# Patient Record
Sex: Male | Born: 1975 | Race: White | Hispanic: No | Marital: Single | Smoking: Current every day smoker
Health system: Southern US, Community
[De-identification: ages and names within clinical notes are randomized; demographics above are authoritative.]

## PROBLEM LIST (undated history)

## (undated) DIAGNOSIS — J449 Chronic obstructive pulmonary disease, unspecified: Secondary | ICD-10-CM

## (undated) DIAGNOSIS — J45909 Unspecified asthma, uncomplicated: Secondary | ICD-10-CM

---

## 2001-05-08 ENCOUNTER — Ambulatory Visit (HOSPITAL_BASED_OUTPATIENT_CLINIC_OR_DEPARTMENT_OTHER): Admission: RE | Admit: 2001-05-08 | Discharge: 2001-05-08 | Payer: Self-pay | Admitting: Dentistry

## 2009-01-18 ENCOUNTER — Emergency Department (HOSPITAL_COMMUNITY): Admission: EM | Admit: 2009-01-18 | Discharge: 2009-01-18 | Payer: Self-pay | Admitting: Family Medicine

## 2011-01-19 NOTE — Op Note (Signed)
[place]  Patient:    Dakota, Timme Ferguson Visit Number: [id number] MRN: [id number]          Service Type: Attending:  Janeece Riggers. Renard Hamper, D.D.S. Dictated by:   Janeece Riggers Renard Hamper, D.D.S. Proc. Date: 05/08/01                             Operative Report  PREOPERATIVE DIAGNOSES: 1. Dental abscess, #10. 2. Gross decay, active severe gum disease. 3. Severe dental phobia.  POSTOPERATIVE DIAGNOSES: 1. Dental abscess, #10. 2. Gross decay, active severe gum disease. 3. Severe dental phobia.  OPERATION:  Extract tooth #10 and #31, crown and bridge preparation tooth #6-13, #18-21 and #30.  Fillings, periodontal scaling and root planing.  SURGEON:  Mark E. Renard Hamper, D.D.S.  ANESTHESIA:  GA via NATT.  ESTIMATED BLOOD LOSS:  Less than 10 cc.  SPECIMENS/CULTURES/DRAINS:  None.  DESCRIPTION OF PROCEDURE:  The patient was brought to day surgery center at [facility].  Consent was obtained from the patient and from the mother, Dakota Ferguson, who has power-of-attorney.  Anesthesia 7.2 cc of 0.5% Marcaine was introduced in all four quadrants.  Throat pack soaked in sterile saline was placed.  Attention was turned to the maxilla where tooth #6-13 had the decay removed and prepared for crown and bridge restorations.  Tooth #3, 4, 5, 14, and 15 were prepared for tooth colored fillings.  Attention was turned to the mandible where teeth #18, #19, #20, #21 were prepared for porcelain fused metal crowns.  Tooth #27, #28, #29 were prepared and filled with composite resin.  Tooth #30 was prepared for porcelain fused metal crown.  Attention was turned to the upper left side where tooth #10 was released with a periosteal elevator, elevated and delivered via forceps.  It was curetted and 4-0 chromic gut suture placed.  Attention was turned to the mandibular right quadrant where tooth #31 was released, elevated and forceps delivered without complication.  The socket  was curetted.  Provisional acrylic crowns were placed.  Four quadrants periodontal root planing was accomplished.  The throat pack was removed, and the patient was returned to anesthesia in good condition. Dictated by:   Janeece Riggers Renard Hamper, D.D.S. Attending:  Janeece Riggers Renard Hamper, D.D.S. DD:  05/08/01 TD:  05/08/01 Job: [id number]

## 2018-11-08 DIAGNOSIS — F209 Schizophrenia, unspecified: Secondary | ICD-10-CM | POA: Insufficient documentation

## 2018-11-08 DIAGNOSIS — F4310 Post-traumatic stress disorder, unspecified: Secondary | ICD-10-CM | POA: Insufficient documentation

## 2019-03-31 DIAGNOSIS — J449 Chronic obstructive pulmonary disease, unspecified: Secondary | ICD-10-CM | POA: Insufficient documentation

## 2019-03-31 DIAGNOSIS — M545 Low back pain, unspecified: Secondary | ICD-10-CM | POA: Insufficient documentation

## 2019-04-13 ENCOUNTER — Emergency Department (HOSPITAL_COMMUNITY): Payer: Medicaid Other

## 2019-04-13 ENCOUNTER — Emergency Department (HOSPITAL_COMMUNITY)
Admission: EM | Admit: 2019-04-13 | Discharge: 2019-04-13 | Disposition: A | Discharge status: Home / Self Care | Payer: Medicaid Other | Attending: Emergency Medicine | Admitting: Emergency Medicine

## 2019-04-13 ENCOUNTER — Other Ambulatory Visit: Payer: Self-pay

## 2019-04-13 ENCOUNTER — Encounter (HOSPITAL_COMMUNITY): Payer: Self-pay | Admitting: *Deleted

## 2019-04-13 DIAGNOSIS — R1084 Generalized abdominal pain: Secondary | ICD-10-CM | POA: Insufficient documentation

## 2019-04-13 DIAGNOSIS — J45909 Unspecified asthma, uncomplicated: Secondary | ICD-10-CM | POA: Insufficient documentation

## 2019-04-13 DIAGNOSIS — F17210 Nicotine dependence, cigarettes, uncomplicated: Secondary | ICD-10-CM | POA: Diagnosis not present

## 2019-04-13 DIAGNOSIS — J449 Chronic obstructive pulmonary disease, unspecified: Secondary | ICD-10-CM | POA: Diagnosis not present

## 2019-04-13 DIAGNOSIS — R140 Abdominal distension (gaseous): Secondary | ICD-10-CM | POA: Diagnosis present

## 2019-04-13 HISTORY — DX: Unspecified asthma, uncomplicated: J45.909

## 2019-04-13 HISTORY — DX: Chronic obstructive pulmonary disease, unspecified: J44.9

## 2019-04-13 LAB — CBC
HCT: 41.6 % (ref 39.0–52.0)
Hemoglobin: 14.1 g/dL (ref 13.0–17.0)
MCH: 30.9 pg (ref 26.0–34.0)
MCHC: 33.9 g/dL (ref 30.0–36.0)
MCV: 91 fL (ref 80.0–100.0)
Platelets: 217 10*3/uL (ref 150–400)
RBC: 4.57 MIL/uL (ref 4.22–5.81)
RDW: 12.8 % (ref 11.5–15.5)
WBC: 8.4 10*3/uL (ref 4.0–10.5)
nRBC: 0 % (ref 0.0–0.2)

## 2019-04-13 LAB — COMPREHENSIVE METABOLIC PANEL
ALT: 30 U/L (ref 0–44)
AST: 30 U/L (ref 15–41)
Albumin: 3.9 g/dL (ref 3.5–5.0)
Alkaline Phosphatase: 56 U/L (ref 38–126)
Anion gap: 9 (ref 5–15)
BUN: 7 mg/dL (ref 6–20)
CO2: 26 mmol/L (ref 22–32)
Calcium: 8.9 mg/dL (ref 8.9–10.3)
Chloride: 104 mmol/L (ref 98–111)
Creatinine, Ser: 0.94 mg/dL (ref 0.61–1.24)
GFR calc Af Amer: >60 mL/min (ref >60)
GFR calc non Af Amer: >60 mL/min (ref >60)
Glucose, Bld: 84 mg/dL (ref 70–99)
Potassium: 3.4 mmol/L — ABNORMAL LOW (ref 3.5–5.1)
Sodium: 139 mmol/L (ref 135–145)
Total Bilirubin: 0.5 mg/dL (ref 0.3–1.2)
Total Protein: 6.8 g/dL (ref 6.5–8.1)

## 2019-04-13 LAB — LIPASE, BLOOD: Lipase: 47 U/L (ref 11–51)

## 2019-04-13 LAB — URINALYSIS, ROUTINE W REFLEX MICROSCOPIC
Bilirubin Urine: NEGATIVE
Glucose, UA: NEGATIVE mg/dL
Hgb urine dipstick: NEGATIVE
Ketones, ur: NEGATIVE mg/dL
Leukocytes,Ua: NEGATIVE
Nitrite: NEGATIVE
Protein, ur: NEGATIVE mg/dL
Specific Gravity, Urine: 1.025 (ref 1.005–1.030)
pH: 7 (ref 5.0–8.0)

## 2019-04-13 MED ORDER — IOHEXOL 300 MG/ML  SOLN
100.0000 mL | Freq: Once | INTRAMUSCULAR | Status: AC | PRN
  Administered 2019-04-13: 09:00:00 100 mL via INTRAVENOUS

## 2019-04-13 MED ORDER — POTASSIUM CHLORIDE CRYS ER 20 MEQ PO TBCR
40.0000 meq | EXTENDED_RELEASE_TABLET | Freq: Once | ORAL | Status: DC
  Filled 2019-04-13: qty 2

## 2019-04-13 MED ORDER — DICYCLOMINE HCL 20 MG PO TABS: 20.0000 mg | ORAL_TABLET | Freq: Three times a day (TID) | ORAL | 0 refills | Status: DC

## 2019-04-13 MED ORDER — SODIUM CHLORIDE 0.9 % IV BOLUS
1000.0000 mL | Freq: Once | INTRAVENOUS | Status: AC
  Administered 2019-04-13: 08:00:00 1000 mL via INTRAVENOUS

## 2019-04-13 MED ORDER — SODIUM CHLORIDE 0.9% FLUSH: 3.0000 mL | Freq: Once | INTRAVENOUS | Status: DC

## 2019-04-13 MED ORDER — HYDROMORPHONE HCL 1 MG/ML IJ SOLN
1.0000 mg | Freq: Once | INTRAMUSCULAR | Status: AC
  Administered 2019-04-13: 08:00:00 1 mg via INTRAVENOUS
  Filled 2019-04-13: qty 1

## 2019-04-13 NOTE — Discharge Instructions (Signed)
If you develop worsening, continued, or recurrent abdominal pain, uncontrolled vomiting, fever, chest or back pain, or any other new/concerning symptoms then return to the ER for evaluation.  

## 2019-04-13 NOTE — ED Notes (Signed)
The pt arrived by ambulance with c/o abd pain for one month   He has been seen in [place] for the same.  He feels like his abd is swollen nausea vomiting and diarrhea he is a smoker  Generalized abd upper and lower abd

## 2019-04-13 NOTE — ED Triage Notes (Signed)
Patient presents to ed via Dakota Ferguson. Ems c/o abd. Pain onset 1 month ago states he was seen at [facility] however wasn't given a dx. States last 2 weeks progressively gotten worse with abd. Distention. Denies n/v states he doesn't have some diarrhea.

## 2019-04-13 NOTE — ED Provider Notes (Signed)
[facility] EMERGENCY DEPARTMENT Provider Note   CSN: [id number] Arrival date & time: 04/13/19  8185    History   Chief Complaint Chief Complaint  Patient presents with  . Abdominal Pain    HPI Dakota Ferguson is a 43 y.o. male.     HPI  43 year old male presents with abdominal pain.  It has been progressively worsening for about a month.  He feels like his abdomen is swollen and feels like something is pushing from the inside out but also towards his back.  He has chronic back pain that he states feels like it is worse with this pain.  No vomiting.  The pain is currently not a 6 out of 10.  He has had diarrhea during this time, about 2 or 3 episodes per day.  No urinary symptoms. Tried tylenol and goody powder occasionally.  Past Medical History:  Diagnosis Date  . Asthma   . COPD (chronic obstructive pulmonary disease) (HCC)     There are no active problems to display for this patient.   History reviewed. No pertinent surgical history.      Home Medications    Prior to Admission medications   Medication Sig Start Date End Date Taking? Authorizing Provider  dicyclomine (BENTYL) 20 MG tablet Take 1 tablet (20 mg total) by mouth 3 (three) times daily before meals. 04/13/19   Sherwood Gambler, MD    Family History No family history on file.  Social History Social History   Tobacco Use  . Smoking status: Current Some Day Smoker  . Smokeless tobacco: Never Used  Substance Use Topics  . Alcohol use: Never    Frequency: Never  . Drug use: Never     Allergies   Patient has no known allergies.   Review of Systems Review of Systems  Gastrointestinal: Positive for abdominal pain and diarrhea. Negative for vomiting.  Genitourinary: Negative for dysuria.  Musculoskeletal: Positive for back pain.  All other systems reviewed and are negative.    Physical Exam Updated Vital Signs BP 113/76   Pulse 60   Temp 97.9 F (36.6 C) (Oral)   Resp 16    Ht 6' (1.829 m)   Wt 81.6 kg   SpO2 96%   BMI 24.41 kg/m   Physical Exam Vitals signs and nursing note reviewed.  Constitutional:      Appearance: He is well-developed.  HENT:     Head: Normocephalic and atraumatic.     Right Ear: External ear normal.     Left Ear: External ear normal.     Nose: Nose normal.  Eyes:     General:        Right eye: No discharge.        Left eye: No discharge.  Neck:     Musculoskeletal: Neck supple.  Cardiovascular:     Rate and Rhythm: Normal rate and regular rhythm.     Heart sounds: Normal heart sounds.  Pulmonary:     Effort: Pulmonary effort is normal.     Breath sounds: Normal breath sounds.  Abdominal:     Palpations: Abdomen is soft.     Tenderness: There is generalized abdominal tenderness (mild). There is no right CVA tenderness, left CVA tenderness or guarding.  Musculoskeletal:     Lumbar back: He exhibits tenderness (mild).  Skin:    General: Skin is warm and dry.  Neurological:     Mental Status: He is alert.  Psychiatric:  Mood and Affect: Mood is not anxious.      ED Treatments / Results  Labs (all labs ordered are listed, but only abnormal results are displayed) Labs Reviewed  COMPREHENSIVE METABOLIC PANEL - Abnormal; Notable for the following components:      Result Value   Potassium 3.4 (*)    All other components within normal limits  URINALYSIS, ROUTINE W REFLEX MICROSCOPIC - Abnormal; Notable for the following components:   Color, Urine AMBER (*)    APPearance HAZY (*)    All other components within normal limits  LIPASE, BLOOD  CBC    EKG None  Radiology Ct Abdomen Pelvis W Contrast  Result Date: 04/13/2019 CLINICAL DATA:  Worsening abdominal pain and distention for 1 month. EXAM: CT ABDOMEN AND PELVIS WITH CONTRAST TECHNIQUE: Multidetector CT imaging of the abdomen and pelvis was performed using the standard protocol following bolus administration of intravenous contrast. CONTRAST:  100mL  OMNIPAQUE IOHEXOL 300 MG/ML  SOLN COMPARISON:  Abdominal radiographs 03/21/2019. Chest CT 10/13/2013. FINDINGS: Lower chest: Partially visualized right lower lobe bulla, similar to the 2015 CT. Dependent subsegmental atelectasis in the left lower lobe. No pleural effusion. Hepatobiliary: 7 mm low-density lesion in the medial segment of the left hepatic lobe, too small to fully characterize. Unremarkable gallbladder. No biliary dilatation. Pancreas: Unremarkable. Spleen: Unremarkable. Adrenals/Urinary Tract: Unremarkable adrenal glands. Numerous bilateral renal masses are again seen with the majority containing macroscopic fat as well as varying amounts of enhancing soft tissue. Some of the masses which were visible on the prior chest CT have enlarged in the interval including a 3.5 cm right upper pole mass (previously 2.9 cm). An exophytic lesion extending anteriorly from the interpolar left kidney measures 7.0 cm. There is no retroperitoneal hemorrhage. No renal calculi or hydronephrosis is evident. The bladder is unremarkable. Stomach/Bowel: The stomach is unremarkable. The 7 cm left renal lesion exerts mild mass effect on proximal small bowel loops. No bowel dilatation or inflammation is identified. The appendix is unremarkable. Vascular/Lymphatic: Normal caliber of the abdominal aorta. No enlarged lymph nodes. Reproductive: Unremarkable prostate. Other: No intraperitoneal free fluid. Musculoskeletal: No acute osseous abnormality. Moderate disc degeneration in the mid and lower lumbar spine with neural foraminal stenosis at L4-5 and L5-S1 and spinal stenosis at L3-4 and L4-5. Scattered subcentimeter sclerotic osseous foci most notable in the pelvis on the left, nonspecific. No destructive osseous lesion. IMPRESSION: 1. No acute abnormality identified in the abdomen or pelvis. 2. 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 Electronically Signed   By: Sebastian AcheAllen  Grady M.D.    On: 04/13/2019 09:31    Procedures Procedures (including critical care time)  Medications Ordered in ED Medications  sodium chloride flush (NS) 0.9 % injection 3 mL (3 mLs Intravenous Not Given 04/13/19 0745)  potassium chloride SA (K-DUR) CR tablet 40 mEq (has no administration in time range)  sodium chloride 0.9 % bolus 1,000 mL (0 mLs Intravenous Stopped 04/13/19 0820)  HYDROmorphone (DILAUDID) injection 1 mg (1 mg Intravenous Given 04/13/19 0742)  iohexol (OMNIPAQUE) 300 MG/ML solution 100 mL (100 mLs Intravenous Contrast Given 04/13/19 0857)     Initial Impression / Assessment and Plan / ED Course  I have reviewed the triage vital signs and the nursing notes.  Pertinent labs & imaging results that were available during my care of the patient were reviewed by me and considered in my medical decision making (see chart for details).        No  clear cause for the patient's generalized abdominal pain.  His labs are reassuring besides minimal hypokalemia.  CT with the cysts but no other significant findings.  At this point, I will treat with Bentyl and have him follow-up with gastroenterology.  We discussed return precautions.  Final Clinical Impressions(s) / ED Diagnoses   Final diagnoses:  Generalized abdominal pain    ED Discharge Orders         Ordered    dicyclomine (BENTYL) 20 MG tablet  3 times daily before meals     04/13/19 0944           Pricilla LovelessGoldston, Chaselyn Nanney, MD 04/13/19 [phone]

## 2019-04-13 NOTE — ED Notes (Signed)
Resting comfortably, requesting something to drink.  Reviewed POC and results at this time.  NAD noted.

## 2019-05-29 DIAGNOSIS — Z8659 Personal history of other mental and behavioral disorders: Secondary | ICD-10-CM | POA: Insufficient documentation

## 2019-05-29 DIAGNOSIS — Z720 Tobacco use: Secondary | ICD-10-CM | POA: Insufficient documentation

## 2019-05-29 DIAGNOSIS — M47816 Spondylosis without myelopathy or radiculopathy, lumbar region: Secondary | ICD-10-CM | POA: Insufficient documentation

## 2019-05-29 DIAGNOSIS — B182 Chronic viral hepatitis C: Secondary | ICD-10-CM | POA: Insufficient documentation

## 2019-11-18 DIAGNOSIS — J329 Chronic sinusitis, unspecified: Secondary | ICD-10-CM | POA: Insufficient documentation

## 2020-10-13 IMAGING — CT CT ABDOMEN AND PELVIS WITH CONTRAST
2 of 5 series · 16 of 46 positions shown, 18 images · IV contrast (Omni 300)
Comparison: Abdominal radiographs 03/21/2019. Chest CT 10/13/2013.

CLINICAL DATA: Worsening abdominal pain and distention for 1 month.

EXAM:
CT ABDOMEN AND PELVIS WITH CONTRAST
TECHNIQUE: Multidetector CT imaging of the abdomen and pelvis was performed
using the standard protocol following bolus administration of
intravenous contrast.
CONTRAST:  100mL OMNIPAQUE IOHEXOL 300 MG/ML  SOLN

[Series 3: a/p w/ 5mm · axial · 0.86mm/px · z∈[-479,-79]mm · 13 of 92 slices shown, 15 images]
[im 6/92  soft-tissue]
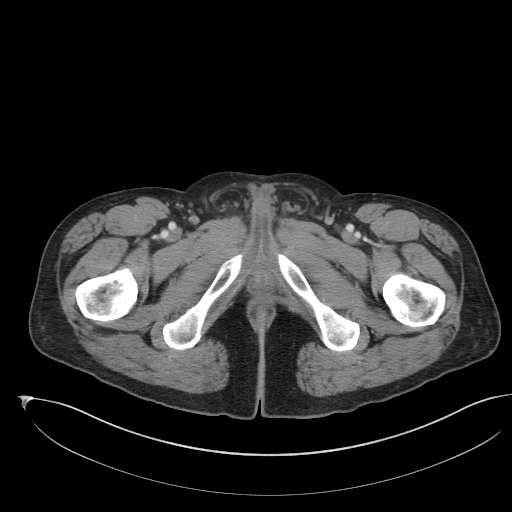
[im 6/92  bone]
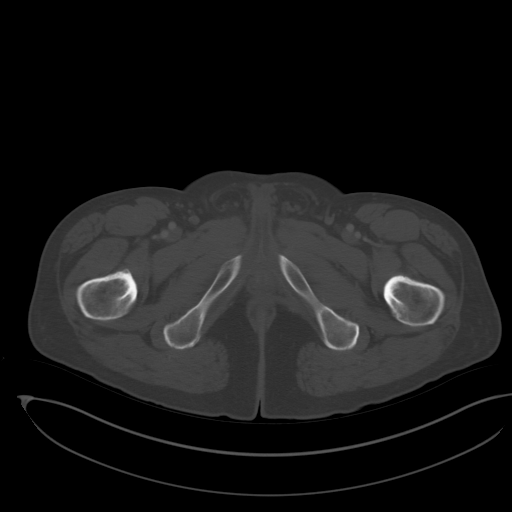
[im 11/92  soft-tissue]
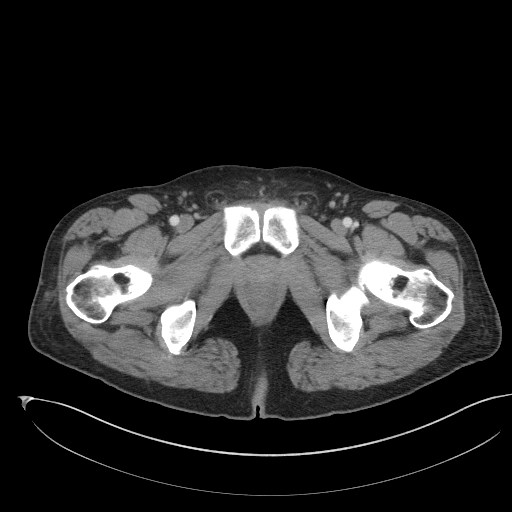
[im 21/92  soft-tissue]
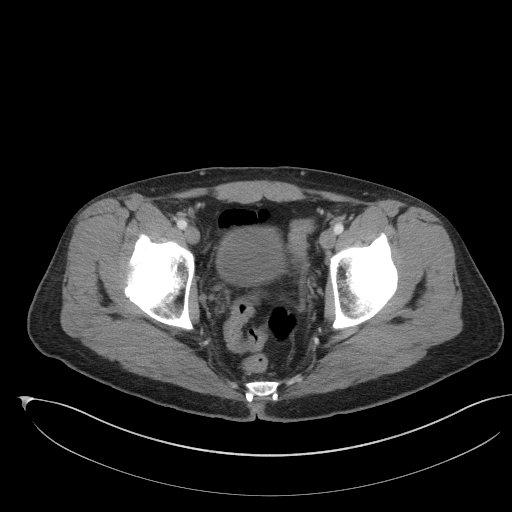
[im 26/92  soft-tissue]
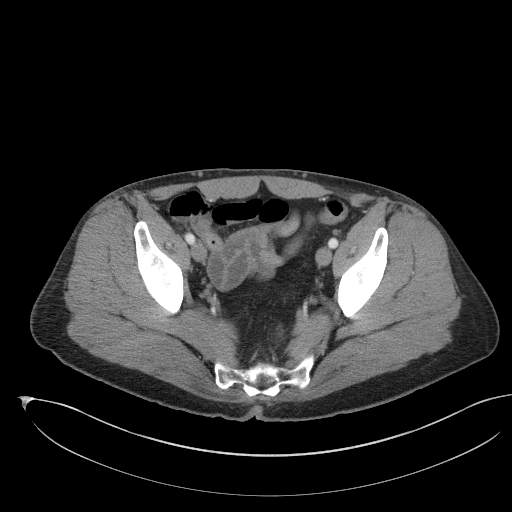
[im 31/92  soft-tissue]
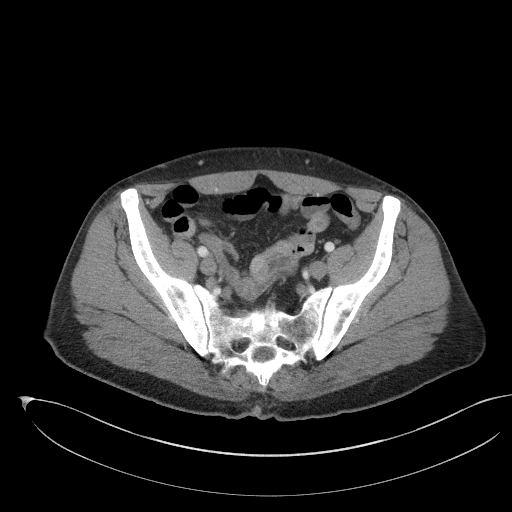
[im 41/92  soft-tissue]
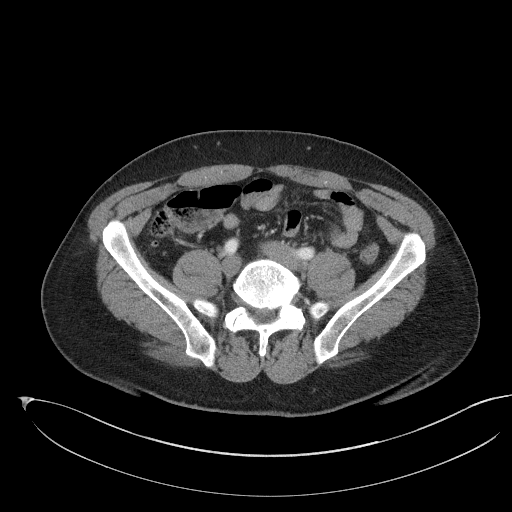
[im 46/92  soft-tissue]
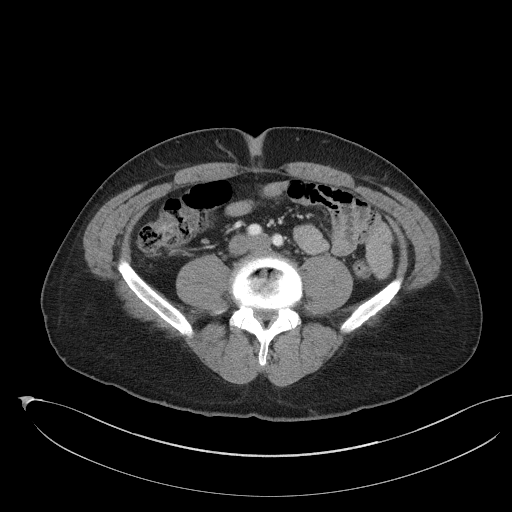
[im 51/92  soft-tissue]
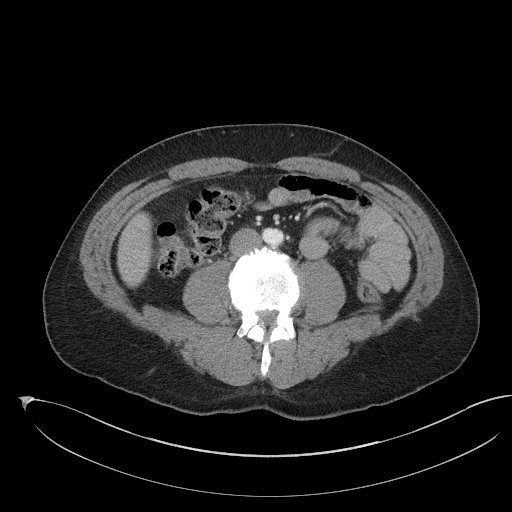
[im 61/92  soft-tissue]
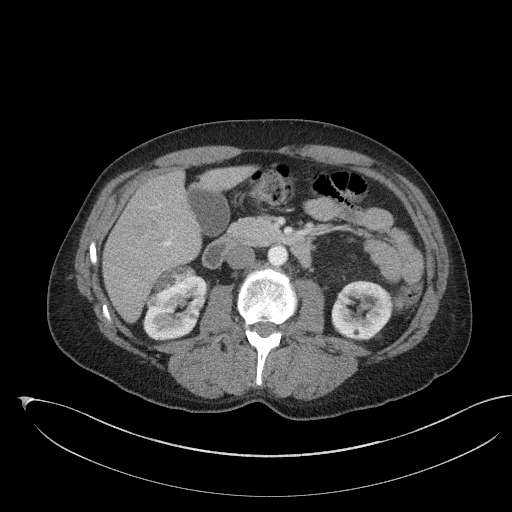
[im 61/92  bone]
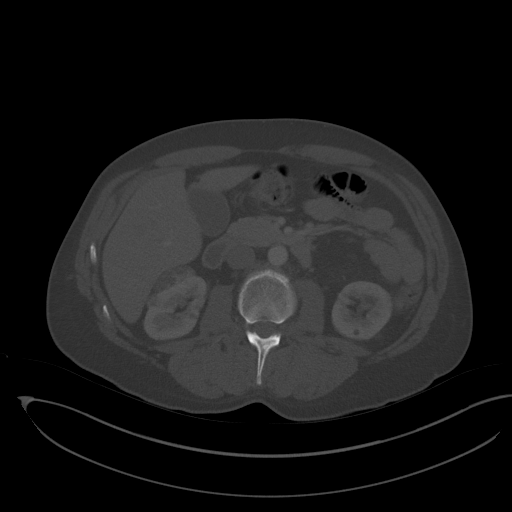
[im 66/92  soft-tissue]
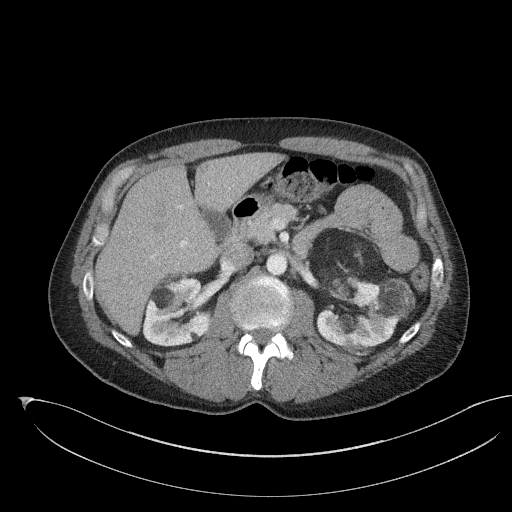
[im 71/92  soft-tissue]
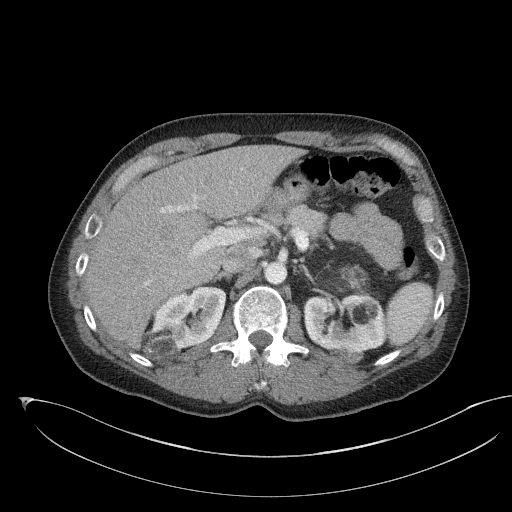
[im 81/92  soft-tissue]
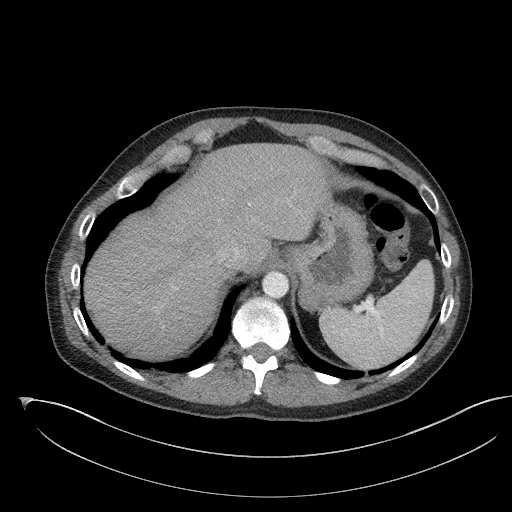
[im 86/92  soft-tissue]
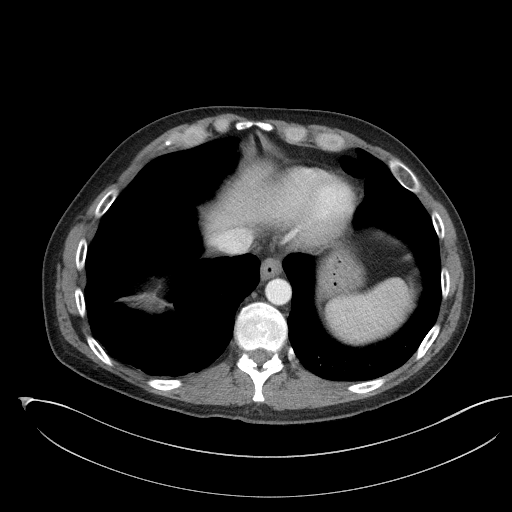

[Series 6: a/p w/ cor · coronal · 0.74mm/px · 3 of 133 slices shown]
[im 45/133  soft-tissue]
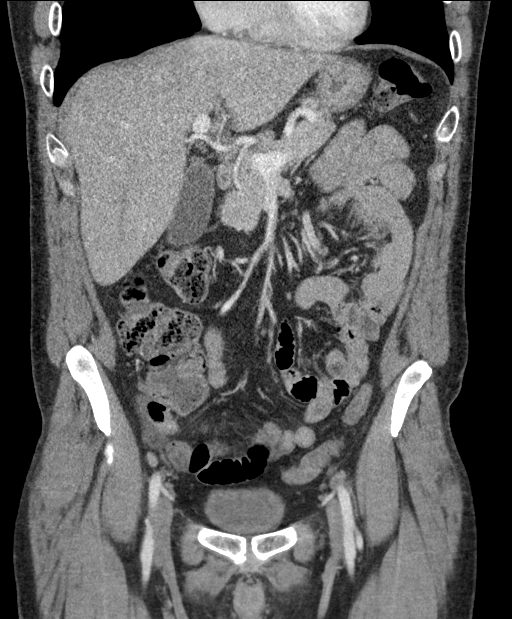
[im 59/133  soft-tissue]
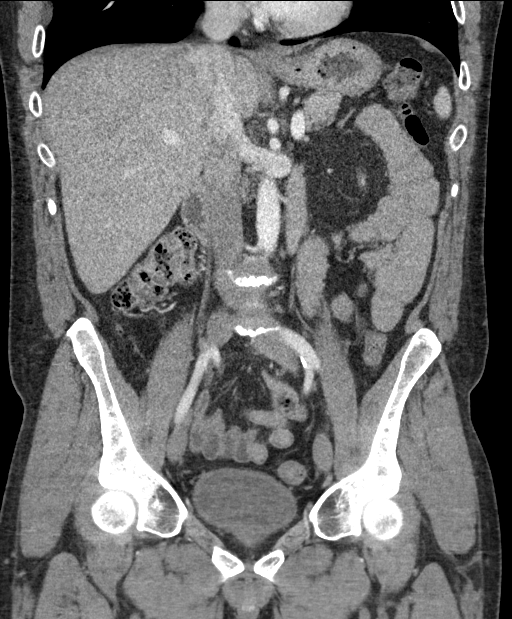
[im 74/133  soft-tissue]
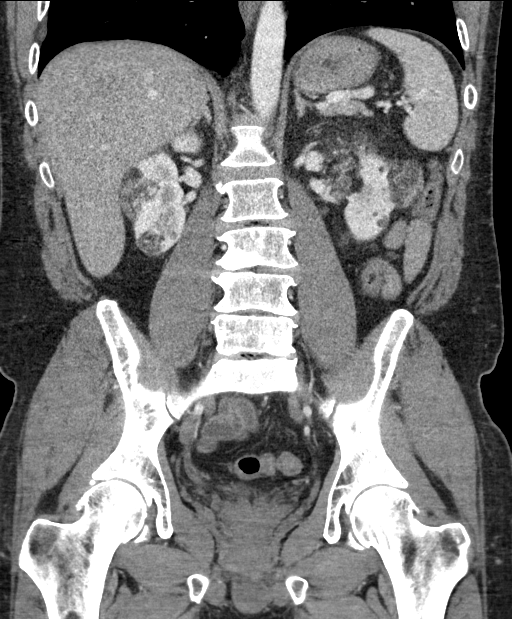

[16 of 46 positions shown; findings below may reference images not displayed]

FINDINGS: Lower chest: Partially visualized right lower lobe bulla, similar to
the 1333 CT. Dependent subsegmental atelectasis in the left lower
lobe. No pleural effusion.

Hepatobiliary: 7 mm low-density lesion in the medial segment of the
left hepatic lobe, too small to fully characterize. Unremarkable
gallbladder. No biliary dilatation.

Pancreas: Unremarkable.

Spleen: Unremarkable.

Adrenals/Urinary Tract: Unremarkable adrenal glands. Numerous
bilateral renal masses are again seen with the majority containing
macroscopic fat as well as varying amounts of enhancing soft tissue.
Some of the masses which were visible on the prior chest CT have
enlarged in the interval including a 3.5 cm right upper pole mass
(previously 2.9 cm). An exophytic lesion extending anteriorly from
the interpolar left kidney measures 7.0 cm. There is no
retroperitoneal hemorrhage. No renal calculi or hydronephrosis is
evident. The bladder is unremarkable.

Stomach/Bowel: The stomach is unremarkable. The 7 cm left renal
lesion exerts mild mass effect on proximal small bowel loops. No
bowel dilatation or inflammation is identified. The appendix is
unremarkable.

Vascular/Lymphatic: Normal caliber of the abdominal aorta. No
enlarged lymph nodes.

Reproductive: Unremarkable prostate.

Other: No intraperitoneal free fluid.

Musculoskeletal: No acute osseous abnormality. Moderate disc
degeneration in the mid and lower lumbar spine with neural foraminal
stenosis at L4-5 and L5-S1 and spinal stenosis at L3-4 and L4-5.
Scattered subcentimeter sclerotic osseous foci most notable in the
pelvis on the left, nonspecific. No destructive osseous lesion.
IMPRESSION: 1. No acute abnormality identified in the abdomen or pelvis.
2. Numerous bilateral renal masses compatible with angiomyolipomas
measuring up to 7 cm.

## 2020-11-30 ENCOUNTER — Ambulatory Visit: Payer: Medicaid Other | Admitting: Family Medicine

## 2020-12-12 ENCOUNTER — Ambulatory Visit: Payer: Medicaid Other | Admitting: Family Medicine

## 2021-06-14 DIAGNOSIS — F1190 Opioid use, unspecified, uncomplicated: Secondary | ICD-10-CM | POA: Insufficient documentation

## 2021-06-14 DIAGNOSIS — L0390 Cellulitis, unspecified: Secondary | ICD-10-CM | POA: Insufficient documentation

## 2021-06-14 DIAGNOSIS — F32A Depression, unspecified: Secondary | ICD-10-CM | POA: Insufficient documentation

## 2021-06-14 DIAGNOSIS — F1193 Opioid use, unspecified with withdrawal: Secondary | ICD-10-CM | POA: Insufficient documentation

## 2021-08-23 DIAGNOSIS — D1771 Benign lipomatous neoplasm of kidney: Secondary | ICD-10-CM | POA: Insufficient documentation

## 2021-08-23 DIAGNOSIS — Q851 Tuberous sclerosis: Secondary | ICD-10-CM | POA: Insufficient documentation

## 2022-06-30 DIAGNOSIS — L03315 Cellulitis of perineum: Secondary | ICD-10-CM | POA: Insufficient documentation

## 2022-07-09 DIAGNOSIS — M726 Necrotizing fasciitis: Secondary | ICD-10-CM | POA: Insufficient documentation

## 2023-05-14 ENCOUNTER — Telehealth: Payer: Self-pay

## 2023-05-14 ENCOUNTER — Ambulatory Visit: Payer: MEDICAID | Admitting: Student

## 2023-05-14 ENCOUNTER — Encounter: Payer: Self-pay | Admitting: Student

## 2023-05-14 VITALS — BP 102/64 | HR 81 | Temp 97.4°F | Resp 18 | Ht 72.0 in | Wt 203.4 lb

## 2023-05-14 DIAGNOSIS — Z6827 Body mass index (BMI) 27.0-27.9, adult: Secondary | ICD-10-CM

## 2023-05-14 DIAGNOSIS — Z7689 Persons encountering health services in other specified circumstances: Secondary | ICD-10-CM | POA: Diagnosis not present

## 2023-05-14 DIAGNOSIS — M47816 Spondylosis without myelopathy or radiculopathy, lumbar region: Secondary | ICD-10-CM | POA: Diagnosis not present

## 2023-05-14 DIAGNOSIS — Z0000 Encounter for general adult medical examination without abnormal findings: Secondary | ICD-10-CM | POA: Insufficient documentation

## 2023-05-14 MED ORDER — PREGABALIN 100 MG PO CAPS: 100.0000 mg | ORAL_CAPSULE | Freq: Two times a day (BID) | ORAL | 0 refills | Status: DC

## 2023-05-14 NOTE — Telephone Encounter (Signed)
Patient called because  [facility] was telling him they didn't have enough to fill his prescription of #180 Lyrica. I called the Pharmacy and was told that his insurance only allows one month supply at a time. They can give him #60 only. Patient informed

## 2023-05-14 NOTE — Addendum Note (Signed)
Addended by: Edwena Blow on: 05/14/2023 04:35 PM   Modules accepted: Orders

## 2023-05-14 NOTE — Progress Notes (Signed)
New Patient Office Visit  Subjective    Patient ID: Dakota Ferguson, male    DOB: July 17, 1976  Age: 47 y.o. MRN: [id number]  CC:  Chief Complaint  Patient presents with   Establish Care    Has a hx. Of lbp from an injury 15 years ago    HPI Dakota Ferguson presents to establish care. His medical history includes COPD, chronic hepatitis C, osteoarthritis of lumbar spine, schizophrenia, depression, and tobacco abuse. He is separated and is not currently working due to issues with his back, he has attempted to receive disability but was unsuccessful. 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   Osteoarthritis: 15 years ago he reports injuring disc in lumbar spine, he has contemplated surgical intervention but has avoided due to the risk factors of not having success. The last MRI was in 2016 at [facility]. Impression: 1. Soft disc protrusion and extrusion at L3-4 compressing the thecal sac and the right L4 nerve root as well as probable compression of the left L4 nerve root in the lateral recess. There is enhancing inflammation around the disc extrusion. 2. Soft disc protrusion and extrusion at L4-5 asymmetric to the left compressing the left L5 nerve. 3. Small central disc extrusion at L5-S1 without neural impingement.   He tells me he was going to pain management at [facility] but stopped going to reduce getting addicted to the medications. Currently taking gabapentin 400 mg TID,he has tried Lyrica before that helped. He tells me that the gabapentin is not making a difference in his pain, rates 10/10. Standing for short-long periods of time increases pain, along with bending and twisting. He says the pain alternates between left and right leg.   He does not drink alcohol and denies illicit drug use. Tobacco user of 1.5 pack a day over 28 years, he sees pulmonology and has a plan for smoking cessation. He has quit before using a vape.   Sonnie is diagnosed with schizophrenia,  depression and anxiety. He began seeing Dr. Claudie Revering with [facility] a month ago and began Prozac 10 mg daily and clonidine 0.1 tablet twice daily. At his appointment today adjustments were made to clonidine he will take 3 times daily and added Trileptal. He feels that his symptoms are being managed and he says he is glad that he is finally receiving the help needed. Today he denies feelings of suicidal ideation, homicidal ideation, worthlessness, helplessness, hypersomnia, or illicit drug use.      Outpatient Encounter Medications as of 05/14/2023  Medication Sig   albuterol (PROVENTIL) (2.5 MG/3ML) 0.083% nebulizer solution Take 2.5 mg by nebulization 4 (four) times daily.   cloNIDine (CATAPRES) 0.1 MG tablet Take 0.1 mg by mouth 3 (three) times daily.   FLUoxetine (PROZAC) 10 MG capsule Take 10 mg by mouth daily.   OXcarbazepine (TRILEPTAL) 150 MG tablet Take 150 mg by mouth 2 (two) times daily.   pregabalin (LYRICA) 100 MG capsule Take 1 capsule (100 mg total) by mouth 2 (two) times daily.   [DISCONTINUED] OLANZapine (ZYPREXA) 5 MG tablet Take 5 mg by mouth 2 (two) times daily.   [DISCONTINUED] dicyclomine (BENTYL) 20 MG tablet Take 1 tablet (20 mg total) by mouth 3 (three) times daily before meals.   [DISCONTINUED] gabapentin (NEURONTIN) 400 MG capsule Take 400 mg by mouth 3 (three) times daily.   No facility-administered encounter medications on file as of 05/14/2023.    Past Medical History:  Diagnosis  Date   Asthma    COPD (chronic obstructive pulmonary disease) (HCC)     No past surgical history on file.  No family history on file.  Social History   Socioeconomic History   Marital status: Single    Spouse name: Not on file   Number of children: Not on file   Years of education: Not on file   Highest education level: Not on file  Occupational History   Not on file  Tobacco Use   Smoking status: Every Day    Current packs/day: 1.50    Average packs/day: 1.5 packs/day for  27.3 years (40.9 ttl pk-yrs)    Types: Cigarettes    Start date: 02/1996   Smokeless tobacco: Current  Substance and Sexual Activity   Alcohol use: Never   Drug use: Never   Sexual activity: Not on file  Other Topics Concern   Not on file  Social History Narrative   Not on file   Social Determinants of Health   Financial Resource Strain: Low Risk  (06/30/2022)   Received from [facility]   Overall Financial Resource Strain (CARDIA)    Difficulty of Paying Living Expenses: Not hard at all  Food Insecurity: Food Insecurity Present (06/30/2022)   Received from [facility]   Hunger Vital Sign    Worried About Running Out of Food in the Last Year: Sometimes true    Ran Out of Food in the Last Year: Sometimes true  Transportation Needs: Unmet Transportation Needs (06/30/2022)   Received from [facility]   PRAPARE - Transportation    Lack of Transportation (Medical): Yes    Lack of Transportation (Non-Medical): Yes  Physical Activity: Sufficiently Active (06/30/2022)   Received from [facility]   Exercise Vital Sign    Days of Exercise per Week: 7 days    Minutes of Exercise per Session: 40 min  Stress: No Stress Concern Present (06/30/2022)   Received from [facility] - Occupational Stress Questionnaire    Feeling of Stress : Not at all  Social Connections: Moderately Isolated (06/30/2022)   Received from [facility]   Social Connection and Isolation Panel [NHANES]    Frequency of Communication with Friends and Family: More than three times a week    Frequency of Social Gatherings with Friends and Family: More than three times a week    Attends Religious Services: More than 4 times per year    Active Member of [employer] or Organizations: No    Attends Banker Meetings: Never    Marital Status: Divorced  Catering manager Violence: Not At Risk (08/01/2022)   Received from [facility]    Humiliation, Afraid, Rape, and Kick questionnaire    Fear of Current or Ex-Partner: No    Emotionally Abused: No    Physically Abused: No    Sexually Abused: No    Review of Systems  Constitutional: Negative.   Respiratory: Negative.    Cardiovascular: Negative.   Musculoskeletal:  Positive for back pain.  Neurological: Negative.   Psychiatric/Behavioral:  Positive for depression.         Objective    BP 102/64 (BP Location: Left Arm, Patient Position: Sitting, Cuff Size: Normal)   Pulse 81   Temp (!) 97.4 F (36.3 C)   Resp 18   Ht 6' (1.829 m)   Wt 203 lb 6 oz (92.3 kg)   SpO2 94%  BMI 27.58 kg/m   Physical Exam Vitals reviewed.  Constitutional:      Appearance: Normal appearance.  Cardiovascular:     Rate and Rhythm: Normal rate and regular rhythm.     Pulses: Normal pulses.  Pulmonary:     Effort: Pulmonary effort is normal.  Abdominal:     General: Abdomen is flat. Bowel sounds are normal.     Palpations: Abdomen is soft.  Musculoskeletal:     Lumbar back: Spasms and tenderness present. No swelling or edema. Decreased range of motion.  Skin:    General: Skin is warm and dry.     Capillary Refill: Capillary refill takes less than 2 seconds.  Neurological:     Mental Status: He is alert and oriented to person, place, and time.  Psychiatric:        Behavior: Behavior normal.        Assessment & Plan:   Problem List Items Addressed This Visit     Osteoarthritis of lumbar spine    I will discontinue gabapentin 400 mg TID and start him on Lyrica 100 mg TID. I have personally reviewed the NCCSRS recipient query regarding this patient and do not see any irregularities or concerning findings.       Establishing care with new doctor, encounter for - Primary    Return in about 3 months (around 08/13/2023) for annual fasting physcial and med refill .   Edwena Blow, NP

## 2023-05-14 NOTE — Assessment & Plan Note (Addendum)
I will discontinue gabapentin 400 mg TID and start him on Lyrica 100 mg TID. I have personally reviewed the NCCSRS recipient query regarding this patient and do not see any irregularities or concerning findings.

## 2023-05-28 ENCOUNTER — Ambulatory Visit: Payer: MEDICAID | Admitting: Student

## 2023-05-28 ENCOUNTER — Encounter: Payer: Self-pay | Admitting: Student

## 2023-05-28 VITALS — BP 128/80 | HR 65 | Temp 98.0°F | Resp 18 | Ht 72.0 in | Wt 201.2 lb

## 2023-05-28 DIAGNOSIS — T50905A Adverse effect of unspecified drugs, medicaments and biological substances, initial encounter: Secondary | ICD-10-CM | POA: Insufficient documentation

## 2023-05-28 DIAGNOSIS — G4700 Insomnia, unspecified: Secondary | ICD-10-CM | POA: Diagnosis not present

## 2023-05-28 NOTE — Assessment & Plan Note (Addendum)
I believe that his reaction was related to Chantix. I am glad that he is feeling back to baseline, I reviewed all medications in [facility] for drug interactions. I asked him to wait to fill the Wellbutrin until he has spoken with the prescriber because he is already using Prozac. I do not feel the reaction was to Lyrica. If he decides to stop and resume gabapentin; I am fine with that.

## 2023-05-28 NOTE — Progress Notes (Signed)
Established Patient Office Visit  Subjective   Patient ID: Dakota Ferguson, male    DOB: Jan 13, 1976  Age: 47 y.o. MRN: [id number]  Chief Complaint  Patient presents with   Medication Reaction    New med made him feel terrible, was started on Lyrica here.    HPI  Dakota Ferguson is a 47 year old Caucasian male who presents with concerns for a medication reaction after starting a few medications in the same week. He was prescribed oxcarbazepine 150 mg, pregabalin 100 mg and Chantix. He reports using this med before without problems, this is his second attempt. He completed two weeks at 0.5 mg and then only a few days of 1 mg. He reports feeling anxious, shortness of breath, scared and in tears, insomnia, cold sweats, and night mares. He states "I felt like the world was ending and someone was coming to get me." He made a decision to stop all three medications. Today he says is the first day that he has felt normal other than a small panic attack prior to the appointment. He denies SI/HI, nausea, or vomiting. His Pulmonologist prescribed Wellbutrin (dose unknown) the patient tells me that he is scared to death to take any new medications. During this appointment we have called the pulmonology office to verify use with Prozac.    Review of Systems  Constitutional: Negative.   Cardiovascular: Negative.   Genitourinary: Negative.   Musculoskeletal: Negative.   Neurological: Negative.   Psychiatric/Behavioral:  The patient is nervous/anxious.       Objective:     BP 128/80   Pulse 65   Temp 98 F (36.7 C)   Resp 18   Ht 6' (1.829 m)   Wt 201 lb 4 oz (91.3 kg)   SpO2 99%   BMI 27.29 kg/m    Physical Exam Constitutional:      Appearance: Normal appearance.  Cardiovascular:     Rate and Rhythm: Normal rate and regular rhythm.     Pulses: Normal pulses.     Heart sounds: Normal heart sounds.  Pulmonary:     Effort: Pulmonary effort is normal.     Breath sounds: Normal breath sounds.   Skin:    General: Skin is warm and dry.     Capillary Refill: Capillary refill takes less than 2 seconds.  Neurological:     Mental Status: He is alert and oriented to person, place, and time.  Psychiatric:        Attention and Perception: Attention normal.        Mood and Affect: Mood is anxious.        Speech: Speech normal.        Behavior: Behavior normal.        Thought Content: Thought content is paranoid. Thought content is not delusional. Thought content does not include homicidal or suicidal ideation.        Judgment: Judgment normal.      No results found for any visits on 05/28/23.    The 10-year ASCVD risk score (Arnett DK, et al., 2019) is: 9.9%    Assessment & Plan:   Problem List Items Addressed This Visit     Medication reaction, initial encounter - Primary    I believe that his reaction was related to Chantix. I am glad that he is feeling back to baseline, I reviewed all medications in [facility] for drug interactions. I asked him to wait to fill the Wellbutrin until he has spoken  with the prescriber because he is already using Prozac. I do not feel the reaction was to Lyrica. If he decides to stop and resume gabapentin; I am fine with that.        No follow-ups on file.    Edwena Blow, NP

## 2023-08-12 ENCOUNTER — Encounter: Payer: Self-pay | Admitting: Student

## 2023-08-12 ENCOUNTER — Other Ambulatory Visit: Payer: Self-pay | Admitting: Internal Medicine

## 2023-08-12 ENCOUNTER — Ambulatory Visit: Payer: MEDICAID | Admitting: Student

## 2023-08-12 VITALS — BP 128/72 | HR 62 | Temp 97.6°F | Resp 18 | Ht 72.0 in | Wt 221.0 lb

## 2023-08-12 DIAGNOSIS — Z6829 Body mass index (BMI) 29.0-29.9, adult: Secondary | ICD-10-CM

## 2023-08-12 DIAGNOSIS — F209 Schizophrenia, unspecified: Secondary | ICD-10-CM | POA: Diagnosis not present

## 2023-08-12 DIAGNOSIS — Z0000 Encounter for general adult medical examination without abnormal findings: Secondary | ICD-10-CM | POA: Diagnosis not present

## 2023-08-12 DIAGNOSIS — E669 Obesity, unspecified: Secondary | ICD-10-CM

## 2023-08-12 DIAGNOSIS — J449 Chronic obstructive pulmonary disease, unspecified: Secondary | ICD-10-CM

## 2023-08-12 DIAGNOSIS — Z1211 Encounter for screening for malignant neoplasm of colon: Secondary | ICD-10-CM

## 2023-08-12 DIAGNOSIS — M545 Low back pain, unspecified: Secondary | ICD-10-CM | POA: Diagnosis not present

## 2023-08-12 MED ORDER — CLONIDINE HCL 0.1 MG PO TABS
0.1000 mg | ORAL_TABLET | Freq: Three times a day (TID) | ORAL | 2 refills | Status: DC
Start: 2023-08-12 — End: 2023-08-12

## 2023-08-12 MED ORDER — CLONIDINE HCL 0.1 MG PO TABS: 0.1000 mg | ORAL_TABLET | Freq: Three times a day (TID) | ORAL | 2 refills | Status: AC

## 2023-08-12 NOTE — Assessment & Plan Note (Signed)
Continue seeing pulmonologist

## 2023-08-12 NOTE — Assessment & Plan Note (Signed)
The patient has not been taking Trileptal. I encouraged him to return to [facility] for medication management and adjustments.

## 2023-08-12 NOTE — Progress Notes (Signed)
Name: Dakota Ferguson   MRN: [id number]    DOB: 10/27/1975   Date:08/12/2023       Progress Note  Subjective  Chief Complaint  Chief Complaint  Patient presents with   Annual Exam    Patient is here for annual physical and medication refills, he had a coffee today with sugar and a monster drink that contains sugar.    HPI  Patient presents for annual CPE with screening labs. His medical history includes COPD, Schizophrenia, PTSD, lower back pain, and depression. He declines flu shot today and reports that he was unaware of an annual physical. He was given the option to reschedule the appointment but the patient declined.   Diet: He does not follow a specific diet and does not cook.  Exercise: He walks 3 times a day.  Last Dental Exam: 6 months ago, extractions and dentures.  Last Eye Exam: 04/2023  The patient has documented history of schizophrenia but tells me that he is also diagnosed with Bipolar disorder. He reports that he was receiving care by Dakota Ferguson at [facility] but says that he has not gone back after an interaction with a provider. He reports taking Clonidine 0.1 mg three times daily and Fluoxetine 10 mg once daily. He states "I finished taking the oxcarbazepine and threw the bottle in the trash." He is unsure when the last dose was. He endorses "I feel like I am in my head and on the edge a lot, I had a panic attack this morning.  Today he denies visual hallucinations or auditory hallucinations.   COPD- he is being seen by pulmonology Dakota Ferguson. He was prescribed Symbicort but is not using this as he states "It made me feel too funny and I think it may be interacting with my other medications."   Low back pain- Standing for short-long periods of time increases pain, along with bending and twisting. He says the pain alternates between left and right leg. At the last visit he requested something stronger to help with lower lumbar back pain. He was using gabapentin 400 mg  TID,  but said the gabapentin was not making a difference in his pain. He asked to go back to lyrica 100 mg instead, prescription was sent on 05/28/2023 and I asked him to stop taking gabapentin. According encounter with Dakota Ferguson pulmonologist on 05/13/2023, the patient requested refills for gabapentin 400 mg. These were filled.   The patient was offered to reschedule this appointment twice prior to starting the visit so that he felt more prepared. He declined and asked that I hurry up so that he could get back to work.  During review of medications and laboratory studies to be completed today, the patients demeanor changed when he was asked for a urine for urine drug screen. He states "I wasn't told that I had to do all of this today, I just urinated before I came back." I offered him a cup of water, he refused and continued to express that he was not told what would be needed for this appointment.  He began putting his coat on while raising his voice stating that he wanted to speak to someone over me and that he is going to report me as the provider to his medical insurance.  Dakota Ferguson my attending physician came to speak with the patient. Unfortunately,  there was no resolve and the patient continued to yelling and refused to follow practice protocol. The patient is to be  dismissed for non compliance.   Hypertension:  BP Readings from Last 3 Encounters:  08/12/23 128/72  05/28/23 128/80  05/14/23 102/64    Obesity: Wt Readings from Last 3 Encounters:  08/12/23 221 lb (100.2 kg)  05/28/23 201 lb 4 oz (91.3 kg)  05/14/23 203 lb 6 oz (92.3 kg)   BMI Readings from Last 3 Encounters:  08/12/23 29.97 kg/m  05/28/23 27.29 kg/m  05/14/23 27.58 kg/m     Lipids:  No results found for: "CHOL" No results found for: "HDL" No results found for: "LDLCALC" No results found for: "TRIG" No results found for: "CHOLHDL" No results found for: "LDLDIRECT" Glucose:  Glucose, Bld  Date Value Ref  Range Status  04/13/2019 84 70 - 99 mg/dL Final   Single STD testing and prevention (HIV/chl/gon/syphilis):  no Sexual history: male partners Hep C Screening: He was tested and reactive. Treatment was completed Skin cancer: Discussed monitoring for atypical lesions.  Colorectal cancer: He denies family history or concerns. Never had a colonoscopy.   Prostate cancer:  no  Lung cancer:  Low Dose CT Chest recommended if Age 55-80 years, 30 pack-year currently smoking OR have quit w/in 15years. Patient  not applicable a candidate for screening.  AAA: The USPSTF recommends one-time screening with ultrasonography in men ages 35 to 75 years who have ever smoked. Patient   not applicable, a candidate for screening  ECG:    Advanced Care Planning: A voluntary discussion about advance care planning including the explanation and discussion of advance directives.  Discussed health care proxy and Living will, and the patient was able to identify a health care proxy as his father Dakota Ferguson.  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  No family  history on file.  Social History   Socioeconomic History   Marital status: Single    Spouse name: Not on file   Number of children: Not on file   Years of education: Not on file   Highest education level: Not on file  Occupational History   Not on file  Tobacco Use   Smoking status: Every Day    Current packs/day: 1.50    Average packs/day: 1.5 packs/day for 27.5 years (41.3 ttl pk-yrs)    Types: Cigarettes    Start date: 02/1996   Smokeless tobacco: Current  Substance and Sexual Activity   Alcohol use: Never   Drug use: Never   Sexual activity: Not on file  Other Topics Concern   Not on file  Social History Narrative   Not on file   Social Determinants of Health   Financial Resource Strain: Low Risk  (06/30/2022)   Received from [facility]  Overall Financial Resource Strain (CARDIA)    Difficulty of Paying Living Expenses: Not hard at all  Food Insecurity: Food Insecurity Present (06/30/2022)   [facility]   Hunger Vital Sign    Worried About Running Out of Food in the Last Year: Sometimes true    Ran Out of Food in the Last Year: Sometimes true  Transportation Needs: Unmet Transportation Needs (06/30/2022)   Received from [facility] - Transportation    Lack of Transportation (Medical): Yes    Lack of Transportation (Non-Medical): Yes  Physical Activity: Sufficiently Active (06/30/2022)   Received from [facility]   Exercise Vital Sign    Days of Exercise per Week: 7 days    Minutes of Exercise per Session: 40 min  Stress: Not on file (07/11/2023)  Social Connections: Moderately Isolated (06/30/2022)   Received from [facility]   Social Connection and Isolation Panel [NHANES]    Frequency of Communication with Friends and Family: More than three times a week    Frequency of Social Gatherings with Friends and Family: More than three times a week    Attends Religious Services: More than 4 times per year    Active  Member of [employer] or Organizations: No    Attends Banker Meetings: Never    Marital Status: Divorced  Catering manager Violence: Not At Risk (08/01/2022)   Received from [facility]   Humiliation, Afraid, Rape, and Kick questionnaire    Fear of Current or Ex-Partner: No    Emotionally Abused: No    Physically Abused: No    Sexually Abused: No     Current Outpatient Medications:    gabapentin (NEURONTIN) 400 MG capsule, Take 400 mg by mouth 3 (three) times daily., Disp: , Rfl:    albuterol (PROVENTIL) (2.5 MG/3ML) 0.083% nebulizer solution, Take 2.5 mg by nebulization 4 (four) times daily., Disp: , Rfl:    cloNIDine (CATAPRES) 0.1 MG tablet, Take 1 tablet (0.1 mg total) by mouth 3 (three) times daily., Disp: 60 tablet, Rfl: 2   FLUoxetine (PROZAC) 10 MG capsule, Take 10 mg by mouth daily., Disp: , Rfl:    OXcarbazepine (TRILEPTAL) 150 MG tablet, Take 150 mg by mouth 2 (two) times daily., Disp: , Rfl:     Review of Systems  Constitutional: Negative.   Eyes: Negative.   Respiratory: Negative.    Cardiovascular: Negative.   Genitourinary: Negative.   Musculoskeletal:  Positive for back pain.  Neurological: Negative.   Psychiatric/Behavioral:  The patient is nervous/anxious.        Objective  Vitals:   08/12/23 1347  BP: 128/72  Pulse: 62  Resp: 18  Temp: 97.6 F (36.4 C)  SpO2: 96%  Weight: 221 lb (100.2 kg)  Height: 6' (1.829 m)    Body mass index is 29.97 kg/m.  Physical Exam Constitutional:      Appearance: Normal appearance. He is obese.  Skin:    General: Skin is warm and dry.     Capillary Refill: Capillary refill takes less than 2 seconds.  Neurological:     General: No focal deficit present.     Mental Status: He is alert.  Psychiatric:        Attention and Perception: He perceives auditory hallucinations. He does not perceive visual hallucinations.        Mood and Affect: Mood is anxious. Affect is labile.        Speech: Speech is  rapid and pressured.        Behavior: Behavior is uncooperative.     Assessment & Plan  Physical exam, annual Assessment & Plan: Screening labs to be completed cmp, cbc, lipids, hem A1c and TSH. The patient left the visit without completion.   Orders: -     HIV Antibody (routine testing w rflx)  Schizophrenia, unspecified type [facility]) Assessment & Plan: The patient has not been taking Trileptal. I encouraged him to return to [facility] for medication management and adjustments.   Orders: -     Comprehensive metabolic panel -     CBC with Differential/Platelet -     cloNIDine HCl; Take 1 tablet (0.1 mg total) by mouth 3 (three) times daily.  Dispense: 60 tablet; Refill: 2  Low back pain without sciatica, unspecified back pain laterality, unspecified chronicity Assessment & Plan: He asked to go back to lyrica 100 mg instead, prescription was sent on 05/28/2023 and I asked him to stop taking gabapentin. According encounter with Dakota Ferguson pulmonologist on 05/13/2023, the patient requested refills for gabapentin 400 mg. These were filled.  Random UDS to be completed, patient refused. See note.   Orders: -     POCT Urine drug screen  Chronic obstructive pulmonary disease, unspecified COPD type (HCC) Assessment & Plan: Continue seeing pulmonologist.    Screening for colon cancer -     Cologuard  Obesity without serious comorbidity, unspecified class, unspecified obesity type -     Lipid panel -     TSH -     Hemoglobin A1c      -Prostate cancer screening and PSA options (with potential risks and benefits of testing vs not testing) were discussed along with recent recs/guidelines. -USPSTF grade A and B recommendations reviewed with patient; age-appropriate recommendations, preventive care, screening tests, etc discussed and encouraged; healthy living encouraged; see AVS for patient education given to patient -Discussed importance of 150 minutes of physical activity weekly, eat two  servings of fish weekly, eat one serving of tree nuts ( cashews, pistachios, pecans, almonds.Marland Kitchen) every other day, eat 6 servings of fruit/vegetables daily and drink plenty of water and avoid sweet beverages.  -Reviewed Health Maintenance: yes

## 2023-08-12 NOTE — Assessment & Plan Note (Signed)
Screening labs to be completed cmp, cbc, lipids, hem A1c and TSH. The patient left the visit without completion.

## 2023-08-12 NOTE — Assessment & Plan Note (Signed)
He asked to go back to lyrica 100 mg instead, prescription was sent on 05/28/2023 and I asked him to stop taking gabapentin. According encounter with Dr. Blenda Nicely pulmonologist on 05/13/2023, the patient requested refills for gabapentin 400 mg. These were filled.  Random UDS to be completed, patient refused. See note.

## 2023-08-12 NOTE — Progress Notes (Signed)
The patient was at the front desk demanding to speak to someone who is in charge.  He is upset that he was not told that this was an annual visit and this was a longer visit than normal.  He tells me that he was never told this was annual exam and basically he states that my NP called "him a liar" when she told him that the front desk calls before hand to confirm their appointment.  He is also upset that my NP is ordering a urine drug screen (UDS) and states that we are supposed to obtain those before the visit.  I informed him that is not true and that I exclusively in my practice order a "random" drug screen at any time.  He states that he already urinated when he got there and we should have told him we were doing a UDS "like every other practice".  I was very calm and tried to discuss what was going on with the patient at that time at the front desk but he was very disrespectful and when I tried to explain to him about it, all he wanted to do was argue and yell.  He told me that it was "taking too long" and he had to go back to work.  My NP told me that the patient is on lyrica and he wanted to go up on his dose of lyrica but since he has a history of opiod use disorder, she wanted a random UDS.   He walked out of the office without doing a UDS or completing any labs as she asked.  I am going to discharge the patient for non-compliance.

## 2023-08-13 NOTE — Addendum Note (Signed)
Addended by: Edwena Blow on: 08/13/2023 04:35 PM   Modules accepted: Level of Service
